# Patient Record
Sex: Female | Born: 1970 | Race: White | Hispanic: Yes | Marital: Single
Health system: Southern US, Community
[De-identification: ages and names within clinical notes are randomized; demographics above are authoritative.]

---

## 2006-01-06 ENCOUNTER — Ambulatory Visit: Payer: Self-pay | Admitting: Family Medicine

## 2006-03-17 ENCOUNTER — Ambulatory Visit: Payer: Self-pay

## 2007-02-07 ENCOUNTER — Ambulatory Visit: Payer: Self-pay

## 2021-01-30 ENCOUNTER — Other Ambulatory Visit: Payer: Self-pay | Admitting: Primary Care

## 2021-01-30 ENCOUNTER — Ambulatory Visit
Admission: RE | Admit: 2021-01-30 | Discharge: 2021-01-30 | Disposition: A | Discharge status: Home / Self Care | Payer: Self-pay | Attending: Primary Care | Admitting: Primary Care

## 2021-01-30 ENCOUNTER — Ambulatory Visit
Admission: RE | Admit: 2021-01-30 | Discharge: 2021-01-30 | Disposition: A | Discharge status: Home / Self Care | Payer: Self-pay | Source: Ambulatory Visit | Attending: Primary Care | Admitting: Primary Care

## 2021-01-30 DIAGNOSIS — R059 Cough, unspecified: Secondary | ICD-10-CM

## 2022-05-19 IMAGING — CR DG CHEST 2V
2 series · 2 of 2 positions shown · non-contrast
Comparison: 03/17/2006

CLINICAL DATA: Three months intermittent cough and chest pain.

EXAM:
CHEST - 2 VIEW

[chest pa]
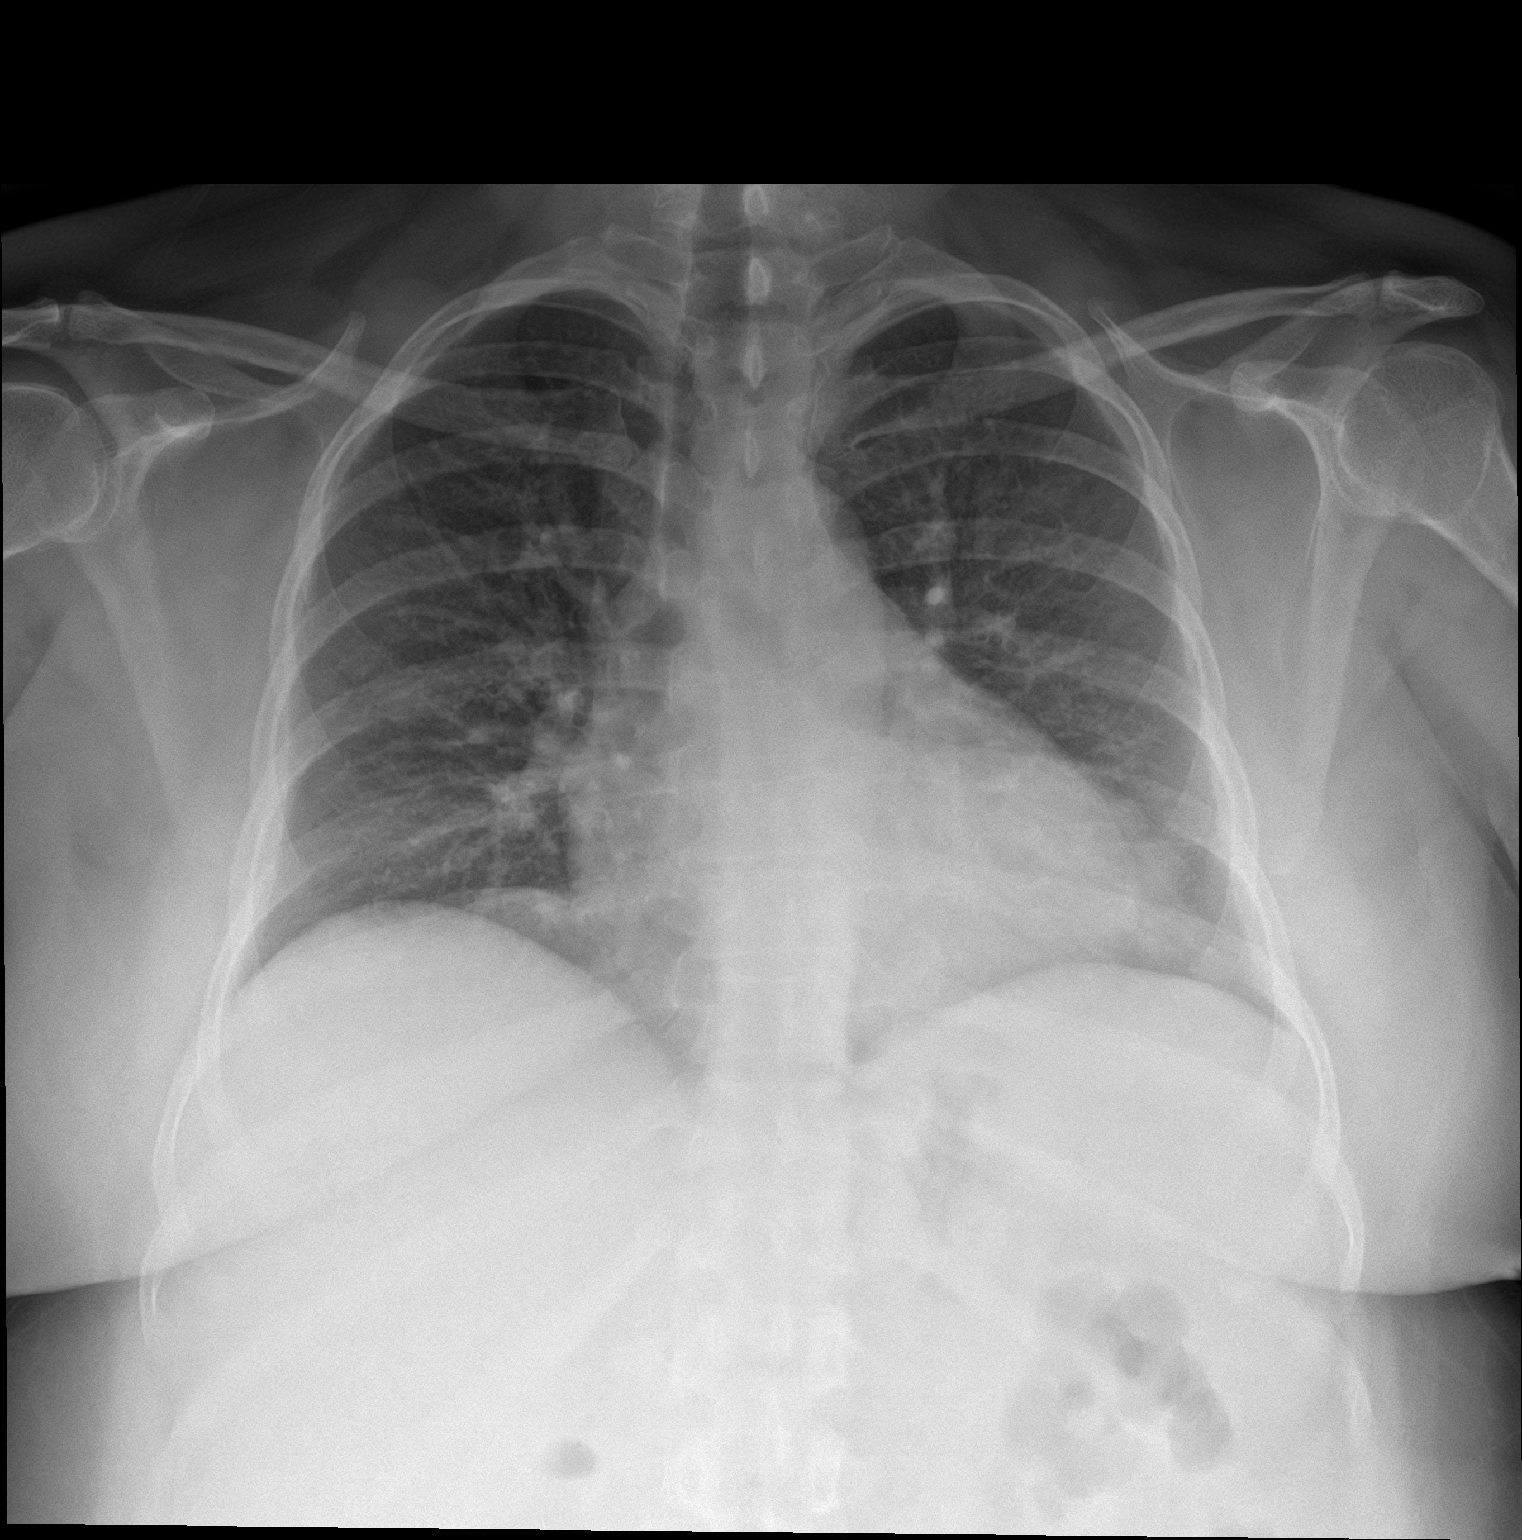

[chest lat]
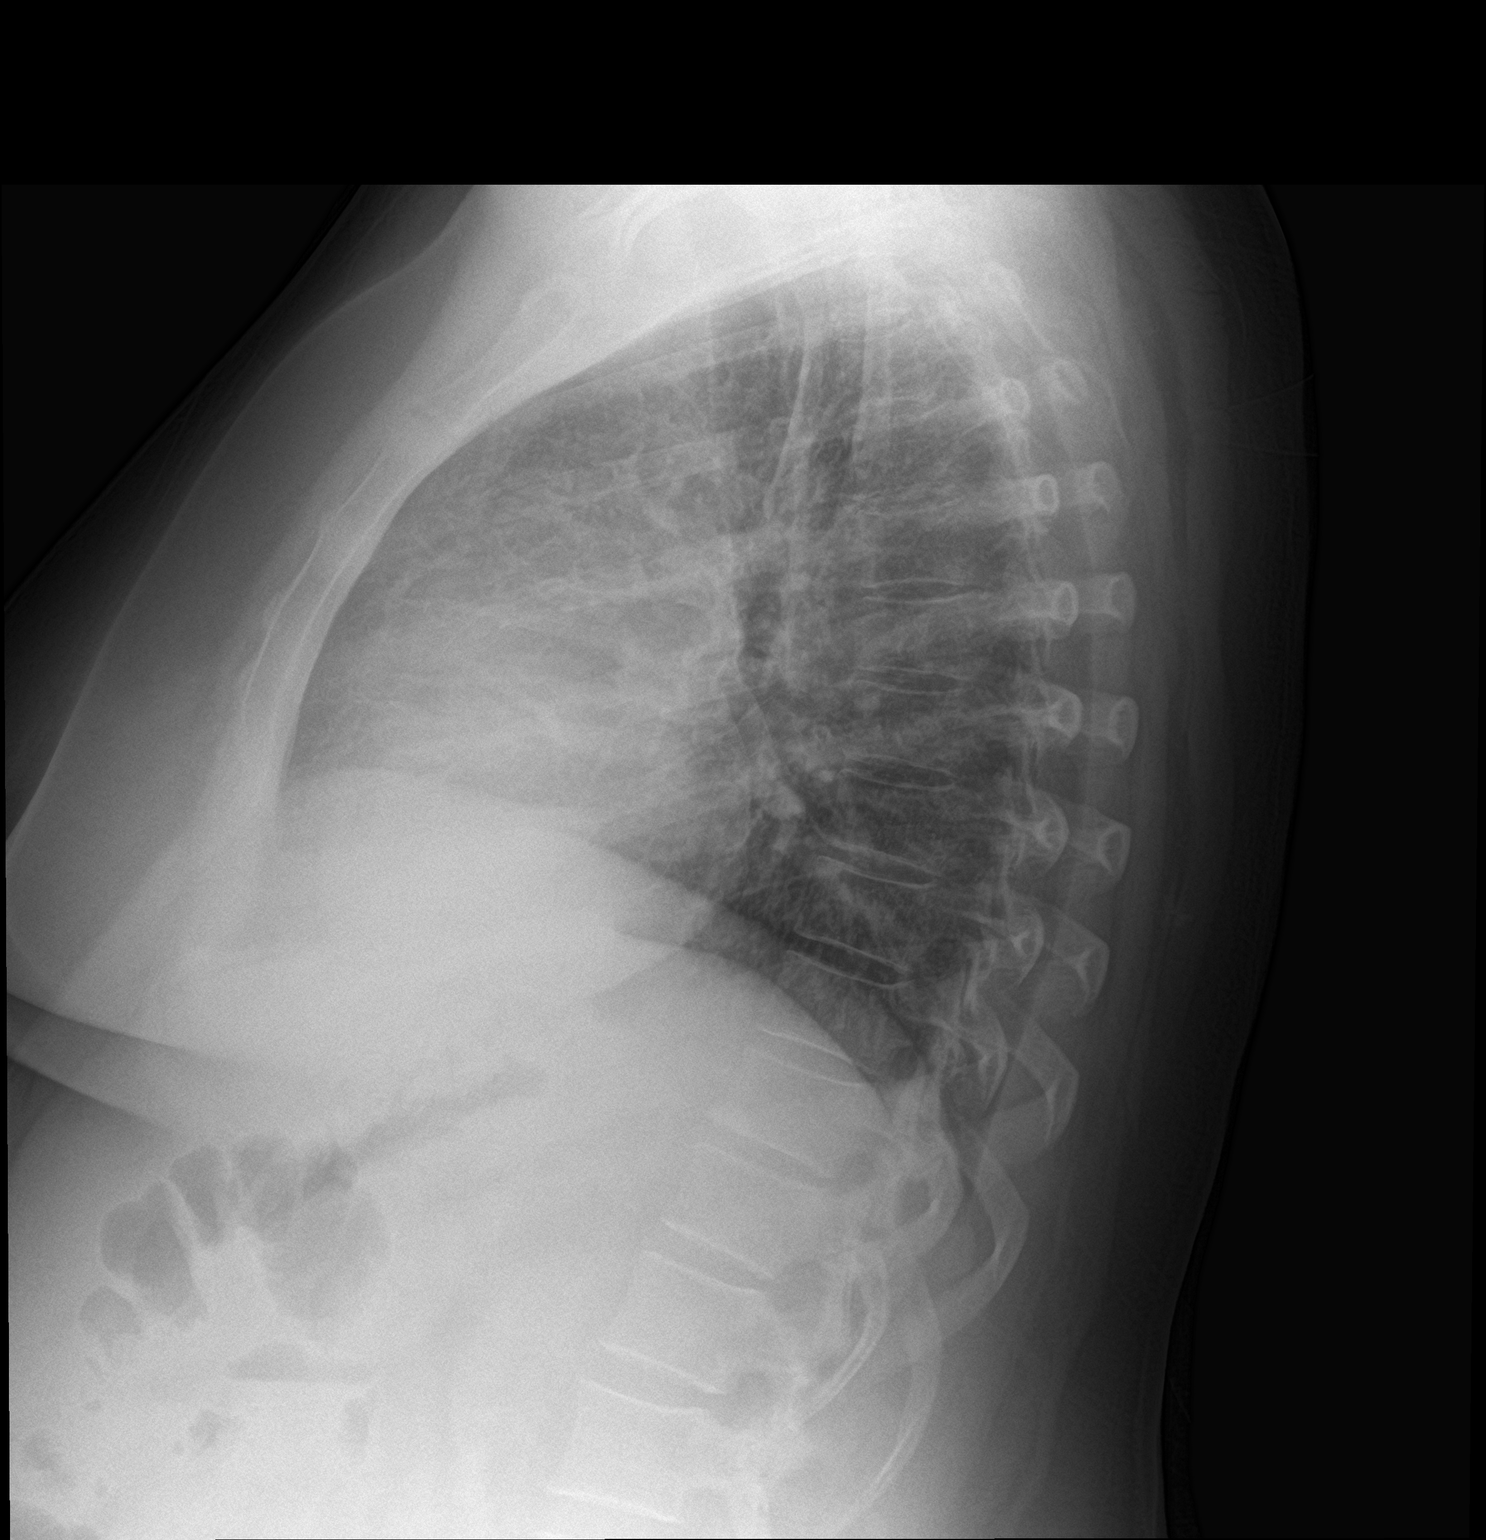

[2 of 2 positions shown; findings below may reference images not displayed]

FINDINGS: Lungs are adequately inflated without focal airspace consolidation
or effusion. Cardiomediastinal silhouette and remainder of the exam
is unchanged.
IMPRESSION: No active cardiopulmonary disease.

## 2022-05-26 ENCOUNTER — Ambulatory Visit (LOCAL_COMMUNITY_HEALTH_CENTER): Payer: BLUE CROSS/BLUE SHIELD

## 2022-05-26 DIAGNOSIS — Z23 Encounter for immunization: Secondary | ICD-10-CM

## 2022-05-26 DIAGNOSIS — Z719 Counseling, unspecified: Secondary | ICD-10-CM

## 2022-05-26 NOTE — Progress Notes (Signed)
Pt seen in clinic for Hep B vaccine requires for Immigration. Pt presented no vaccine record, pt states she had Covid moderna and tdap vaccines yesterday without any problems. Eligible, pt agreed for Heplisav-B vaccine, administered, tolerated well. Provided VIS and NCIR copy, informed of the next vaccine visits. M.Delise Simenson, LPN.

## 2022-05-27 ENCOUNTER — Encounter: Payer: Self-pay | Admitting: Primary Care

## 2022-06-08 ENCOUNTER — Ambulatory Visit (LOCAL_COMMUNITY_HEALTH_CENTER): Payer: BLUE CROSS/BLUE SHIELD

## 2022-06-08 DIAGNOSIS — Z23 Encounter for immunization: Secondary | ICD-10-CM

## 2022-06-08 DIAGNOSIS — Z719 Counseling, unspecified: Secondary | ICD-10-CM

## 2022-06-08 NOTE — Progress Notes (Signed)
Patient seen in nurse clinic.  Vaccinations are for immigration.  Patient wanted MMR and Hep B vaccines today. Discussed date when Hep B and Tdap due in April so unable to get Hep B or Tdap today.  VIS provided. MMR given subcutaneously left arm.  Tolerated well. NCIR updated and 2 copies provided.  Discussed again return dates for vaccine shots. Patient has appointment for 06/24/22.

## 2022-07-06 ENCOUNTER — Ambulatory Visit (LOCAL_COMMUNITY_HEALTH_CENTER): Payer: BLUE CROSS/BLUE SHIELD

## 2022-07-06 ENCOUNTER — Ambulatory Visit: Payer: BLUE CROSS/BLUE SHIELD

## 2022-07-06 DIAGNOSIS — Z23 Encounter for immunization: Secondary | ICD-10-CM

## 2022-07-06 DIAGNOSIS — Z719 Counseling, unspecified: Secondary | ICD-10-CM

## 2022-07-06 NOTE — Progress Notes (Signed)
Patient seen in nurse clinic for immigration vaccines. Hep B and MMR are the vaccines immigration has told her she needed.  Tdap given as 2nd dose to series.  Hep B IM right deltoid and MMR subcutaneously in right arm.  Tdap IM left deltoid. Tolerated well. VIS for each provided. [facility]IR updated and 2 copies provided.  Discussed return date for Tdap.  No interpreter needed - patient able to understand and speak [place].

## 2023-01-19 ENCOUNTER — Other Ambulatory Visit: Payer: Self-pay | Admitting: Primary Care

## 2023-01-19 DIAGNOSIS — Z1231 Encounter for screening mammogram for malignant neoplasm of breast: Secondary | ICD-10-CM

## 2023-06-27 ENCOUNTER — Ambulatory Visit
Admission: RE | Admit: 2023-06-27 | Discharge: 2023-06-27 | Disposition: A | Discharge status: Home / Self Care | Payer: Self-pay | Source: Ambulatory Visit | Attending: Primary Care | Admitting: Primary Care

## 2023-06-27 DIAGNOSIS — Z1231 Encounter for screening mammogram for malignant neoplasm of breast: Secondary | ICD-10-CM | POA: Insufficient documentation
